# Patient Record
Sex: Male | Born: 1990 | Race: Black or African American | Hispanic: No | Marital: Single | Smoking: Light tobacco smoker
Health system: Southern US, Community
[De-identification: ages and names within clinical notes are randomized; demographics above are authoritative.]

## PROBLEM LIST (undated history)

## (undated) HISTORY — PX: KNEE ARTHROSCOPY: SUR90

## (undated) HISTORY — PX: TYMPANOSTOMY TUBE PLACEMENT: SHX32

---
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
## 2008-06-28 ENCOUNTER — Emergency Department (HOSPITAL_COMMUNITY): Admission: EM | Admit: 2008-06-28 | Discharge: 2008-06-29 | Payer: Self-pay | Admitting: Emergency Medicine

## 2008-10-12 ENCOUNTER — Emergency Department (HOSPITAL_COMMUNITY): Admission: EM | Admit: 2008-10-12 | Discharge: 2008-10-12 | Payer: Self-pay | Admitting: Family Medicine

## 2009-02-22 ENCOUNTER — Emergency Department (HOSPITAL_COMMUNITY): Admission: EM | Admit: 2009-02-22 | Discharge: 2009-02-22 | Payer: Self-pay | Admitting: Emergency Medicine

## 2009-06-01 ENCOUNTER — Emergency Department (HOSPITAL_COMMUNITY): Admission: EM | Admit: 2009-06-01 | Discharge: 2009-06-01 | Payer: Self-pay | Admitting: Emergency Medicine

## 2009-08-17 ENCOUNTER — Emergency Department (HOSPITAL_COMMUNITY): Admission: EM | Admit: 2009-08-17 | Discharge: 2009-08-17 | Payer: Self-pay | Admitting: Emergency Medicine

## 2010-05-30 LAB — POCT RAPID STREP A (OFFICE): Streptococcus, Group A Screen (Direct): NEGATIVE

## 2010-05-30 LAB — POCT INFECTIOUS MONO SCREEN: Mono Screen: NEGATIVE

## 2010-06-01 ENCOUNTER — Inpatient Hospital Stay (INDEPENDENT_AMBULATORY_CARE_PROVIDER_SITE_OTHER)
Admission: RE | Admit: 2010-06-01 | Discharge: 2010-06-01 | Disposition: A | Discharge status: Home / Self Care | Source: Ambulatory Visit | Attending: Family Medicine | Admitting: Family Medicine

## 2010-06-01 ENCOUNTER — Ambulatory Visit (INDEPENDENT_AMBULATORY_CARE_PROVIDER_SITE_OTHER)

## 2010-06-01 DIAGNOSIS — H109 Unspecified conjunctivitis: Secondary | ICD-10-CM

## 2010-06-01 DIAGNOSIS — J45909 Unspecified asthma, uncomplicated: Secondary | ICD-10-CM

## 2010-06-02 ENCOUNTER — Emergency Department (HOSPITAL_COMMUNITY)
Admission: EM | Admit: 2010-06-02 | Discharge: 2010-06-03 | Disposition: A | Discharge status: Home / Self Care | Attending: Emergency Medicine | Admitting: Emergency Medicine

## 2010-06-02 DIAGNOSIS — R05 Cough: Secondary | ICD-10-CM | POA: Insufficient documentation

## 2010-06-02 DIAGNOSIS — F29 Unspecified psychosis not due to a substance or known physiological condition: Secondary | ICD-10-CM | POA: Insufficient documentation

## 2010-06-02 DIAGNOSIS — R112 Nausea with vomiting, unspecified: Secondary | ICD-10-CM | POA: Insufficient documentation

## 2010-06-02 DIAGNOSIS — J029 Acute pharyngitis, unspecified: Secondary | ICD-10-CM | POA: Insufficient documentation

## 2010-06-02 DIAGNOSIS — R059 Cough, unspecified: Secondary | ICD-10-CM | POA: Insufficient documentation

## 2010-06-02 DIAGNOSIS — R5381 Other malaise: Secondary | ICD-10-CM | POA: Insufficient documentation

## 2010-06-02 DIAGNOSIS — R509 Fever, unspecified: Secondary | ICD-10-CM | POA: Insufficient documentation

## 2010-06-02 DIAGNOSIS — B9789 Other viral agents as the cause of diseases classified elsewhere: Secondary | ICD-10-CM | POA: Insufficient documentation

## 2010-06-02 DIAGNOSIS — R55 Syncope and collapse: Secondary | ICD-10-CM | POA: Insufficient documentation

## 2010-06-02 DIAGNOSIS — R5383 Other fatigue: Secondary | ICD-10-CM | POA: Insufficient documentation

## 2010-06-02 DIAGNOSIS — J069 Acute upper respiratory infection, unspecified: Secondary | ICD-10-CM | POA: Insufficient documentation

## 2010-06-02 DIAGNOSIS — R404 Transient alteration of awareness: Secondary | ICD-10-CM | POA: Insufficient documentation

## 2010-06-02 DIAGNOSIS — J3489 Other specified disorders of nose and nasal sinuses: Secondary | ICD-10-CM | POA: Insufficient documentation

## 2010-06-02 LAB — POCT I-STAT, CHEM 8
Chloride: 101 mEq/L (ref 96–112)
Creatinine, Ser: 1.3 mg/dL (ref 0.4–1.5)
HCT: 48 % (ref 39.0–52.0)
Potassium: 3.7 mEq/L (ref 3.5–5.1)
Sodium: 139 mEq/L (ref 135–145)

## 2012-06-15 ENCOUNTER — Emergency Department (HOSPITAL_COMMUNITY)
Admission: EM | Admit: 2012-06-15 | Discharge: 2012-06-16 | Disposition: A | Discharge status: Home / Self Care | Payer: Worker's Compensation | Attending: Emergency Medicine | Admitting: Emergency Medicine

## 2012-06-15 DIAGNOSIS — Y9389 Activity, other specified: Secondary | ICD-10-CM | POA: Insufficient documentation

## 2012-06-15 DIAGNOSIS — Z79899 Other long term (current) drug therapy: Secondary | ICD-10-CM | POA: Insufficient documentation

## 2012-06-15 DIAGNOSIS — Z23 Encounter for immunization: Secondary | ICD-10-CM | POA: Insufficient documentation

## 2012-06-15 DIAGNOSIS — S0502XA Injury of conjunctiva and corneal abrasion without foreign body, left eye, initial encounter: Secondary | ICD-10-CM

## 2012-06-15 DIAGNOSIS — S058X9A Other injuries of unspecified eye and orbit, initial encounter: Secondary | ICD-10-CM | POA: Insufficient documentation

## 2012-06-15 DIAGNOSIS — IMO0002 Reserved for concepts with insufficient information to code with codable children: Secondary | ICD-10-CM | POA: Insufficient documentation

## 2012-06-15 DIAGNOSIS — Y990 Civilian activity done for income or pay: Secondary | ICD-10-CM | POA: Insufficient documentation

## 2012-06-15 DIAGNOSIS — H538 Other visual disturbances: Secondary | ICD-10-CM | POA: Insufficient documentation

## 2012-06-15 DIAGNOSIS — Y9289 Other specified places as the place of occurrence of the external cause: Secondary | ICD-10-CM | POA: Insufficient documentation

## 2012-06-15 MED ORDER — TETANUS-DIPHTH-ACELL PERTUSSIS 5-2.5-18.5 LF-MCG/0.5 IM SUSP
0.5000 mL | Freq: Once | INTRAMUSCULAR | Status: AC
  Administered 2012-06-16: 0.5 mL via INTRAMUSCULAR
  Filled 2012-06-15: qty 0.5

## 2012-06-15 MED ORDER — FLUORESCEIN SODIUM 1 MG OP STRP
1.0000 | ORAL_STRIP | Freq: Once | OPHTHALMIC | Status: AC
  Administered 2012-06-16: 1 via OPHTHALMIC

## 2012-06-15 MED ORDER — TETRACAINE HCL 0.5 % OP SOLN
2.0000 [drp] | Freq: Once | OPHTHALMIC | Status: AC
  Administered 2012-06-16: 2 [drp] via OPHTHALMIC

## 2012-06-15 NOTE — ED Provider Notes (Signed)
History  This chart was scribed for non-physician practitioner Jaci Carrel, PA-C working with Loren Racer, MD, by Candelaria Stagers, ED Scribe. This patient was seen in room [facility] and the patient's care was started at 11:49 PM   CSN: [id number]  Arrival date & time 06/15/12  2320   None     No chief complaint on file.    The history is provided by the patient. No language interpreter was used.   John Griffith is a 22 y.o. male who presents to the Emergency Department complaining of sudden onset of left eye pain after getting a piece of glass in his eye after dropping a light bulb earlier today while at work.  Pt flushed the eye for about twenty minutes before arriving to ED with no relief.  Pt is experiencing blurred vision.  He is unsure of last tetanus. Non contact lens wearer     No past medical history on file.  No past surgical history on file.  No family history on file.  History  Substance Use Topics  . Smoking status: Not on file  . Smokeless tobacco: Not on file  . Alcohol Use: Not on file      Review of Systems  Allergies  Review of patient's allergies indicates no known allergies.  Home Medications   Current Outpatient Rx  Name  Route  Sig  Dispense  Refill  . loratadine (CLARITIN) 10 MG tablet   Oral   Take 10 mg by mouth daily.           There were no vitals taken for this visit.  Physical Exam  Nursing note and vitals reviewed. Constitutional: He is oriented to person, place, and time. He appears well-developed and well-nourished. No distress.  HENT:  Head: Normocephalic and atraumatic.  Eyes: Conjunctivae and EOM are normal. Pupils are equal, round, and reactive to light. No foreign body present in the left eye.  Pain free EOMs, no photosensitivity.  No proptosis, lid swelling, hyphema, purulent discharge from eyes, or consensual photophobia.  Eyelids everted, no evidence of FB.  Fluorescein study: corneal uptake, but no dendritic  pattern, c/w corneal abrasions.  Neck: Normal range of motion. Neck supple.  Pulmonary/Chest: Effort normal.  Neurological: He is alert and oriented to person, place, and time.  Skin: Skin is warm and dry. No rash noted. He is not diaphoretic.  Psychiatric: His behavior is normal.    ED Course  Procedures   DIAGNOSTIC STUDIES: BP 137/76  Pulse 89  Temp(Src) 98.6 F (37 C)  Resp 20  SpO2 100% .    COORDINATION OF CARE:  11:51 PM Will further examine eye with fluorescin uptake. Pt understands and agrees.   11:57 PM Will prescribe antibiotics.  Pt understands and agrees.   Labs Reviewed - No data to display No results found.   No diagnosis found.    MDM    Corneal abrasion  Pt with corneal abrasion on PE. Tdap given. Eye irrigated w NS, no evidence of FB.  No change in vision, acuity equal bilaterally.  Pt is not a contact lens wearer.  Exam non-concerning for orbital cellulitis, hyphema, corneal ulcers. Patient will be discharged home with erythromycin.   Patient understands to follow up with ophthalmology, & to return to ER if new symptoms develop including change in vision, purulent drainage, or entrapment. Driving restrictions discussed and eye patched.    I personally performed the services described in this documentation, which was scribed in my presence. The  recorded information has been reviewed and is accurate.         Jaci Carrel, [place] 06/16/12 0006

## 2012-06-16 ENCOUNTER — Encounter (HOSPITAL_COMMUNITY): Payer: Self-pay | Admitting: *Deleted

## 2012-06-16 MED ORDER — ERYTHROMYCIN 5 MG/GM OP OINT: TOPICAL_OINTMENT | OPHTHALMIC | Status: DC

## 2012-06-16 NOTE — ED Notes (Signed)
Visual acuity 20/20 both eyes with corrective lens; 20/25 left eye; 20/20 right eye; pt states wears glasses all the time; does not wear contacts; eye patch placed per PA at pt request for comfort
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
## 2012-06-19 NOTE — ED Provider Notes (Signed)
Medical screening examination/treatment/procedure(s) were performed by non-physician practitioner and as supervising physician I was immediately available for consultation/collaboration.   Loren Racer, MD 06/19/12 1348

## 2012-12-26 ENCOUNTER — Emergency Department (HOSPITAL_COMMUNITY): Payer: Worker's Compensation

## 2012-12-26 ENCOUNTER — Encounter (HOSPITAL_COMMUNITY): Payer: Self-pay | Admitting: Emergency Medicine

## 2012-12-26 ENCOUNTER — Emergency Department (HOSPITAL_COMMUNITY)
Admission: EM | Admit: 2012-12-26 | Discharge: 2012-12-26 | Disposition: A | Discharge status: Home / Self Care | Payer: Worker's Compensation | Attending: Emergency Medicine | Admitting: Emergency Medicine

## 2012-12-26 DIAGNOSIS — Y9289 Other specified places as the place of occurrence of the external cause: Secondary | ICD-10-CM | POA: Insufficient documentation

## 2012-12-26 DIAGNOSIS — M25512 Pain in left shoulder: Secondary | ICD-10-CM

## 2012-12-26 DIAGNOSIS — X500XXA Overexertion from strenuous movement or load, initial encounter: Secondary | ICD-10-CM | POA: Insufficient documentation

## 2012-12-26 DIAGNOSIS — Y990 Civilian activity done for income or pay: Secondary | ICD-10-CM | POA: Insufficient documentation

## 2012-12-26 DIAGNOSIS — S4980XA Other specified injuries of shoulder and upper arm, unspecified arm, initial encounter: Secondary | ICD-10-CM | POA: Insufficient documentation

## 2012-12-26 DIAGNOSIS — S46909A Unspecified injury of unspecified muscle, fascia and tendon at shoulder and upper arm level, unspecified arm, initial encounter: Secondary | ICD-10-CM | POA: Insufficient documentation

## 2012-12-26 DIAGNOSIS — Y9389 Activity, other specified: Secondary | ICD-10-CM | POA: Insufficient documentation

## 2012-12-26 MED ORDER — IBUPROFEN 600 MG PO TABS: 600.0000 mg | ORAL_TABLET | Freq: Four times a day (QID) | ORAL | Status: AC | PRN

## 2012-12-26 MED ORDER — TRAMADOL HCL 50 MG PO TABS: 50.0000 mg | ORAL_TABLET | Freq: Four times a day (QID) | ORAL | Status: AC | PRN

## 2012-12-26 NOTE — ED Notes (Signed)
Pt states that he was just lifting something heavy at work and feels like he may have injured his L shoulder.

## 2012-12-26 NOTE — ED Notes (Signed)
EDPA Kelly at bedside 

## 2012-12-26 NOTE — ED Provider Notes (Signed)
CSN: [id number]     Arrival date & time 12/26/12  0041 History   First MD Initiated Contact with Patient 12/26/12 0045     Chief Complaint  Patient presents with  . Shoulder Injury   (Consider location/radiation/quality/duration/timing/severity/associated sxs/prior Treatment) HPI Comments: Patient is a 22 y/o male who presents for sudden onset L shoulder pain after attempting to lift a heavy box from chest level to lower it onto the floor. Patient felt a pop in his shoulder followed by sudden onset throbbing pain that is nonradiating. Pain constant since onset. Symptoms associated with decreased ROM. Pain worse with palpation and movement. No hx of prior should dislocations.   Patient is a 22 y.o. male presenting with shoulder injury. The history is provided by the patient. No language interpreter was used.  Shoulder Injury This is a new problem. The current episode started today. The problem occurs constantly. The problem has been unchanged. Associated symptoms include arthralgias, joint swelling and myalgias. Pertinent negatives include no fever, numbness or weakness. Exacerbated by: movement and palpation. He has tried ice for the symptoms. The treatment provided mild relief.    History reviewed. No pertinent past medical history. Past Surgical History  Procedure Laterality Date  . Knee arthroscopy    . Tympanostomy tube placement     No family history on file. History  Substance Use Topics  . Smoking status: Never Smoker   . Smokeless tobacco: Not on file  . Alcohol Use: No    Review of Systems  Constitutional: Negative for fever.  Musculoskeletal: Positive for arthralgias, joint swelling and myalgias.  Neurological: Negative for weakness and numbness.  All other systems reviewed and are negative.    Allergies  Review of patient's allergies indicates no known allergies.  Home Medications   Current Outpatient Rx  Name  Route  Sig  Dispense  Refill  . Vitamin D,  Ergocalciferol, (DRISDOL) 50000 UNITS CAPS capsule   Oral   Take 50,000 Units by mouth every 7 (seven) days. monday         . ibuprofen (ADVIL,MOTRIN) 600 MG tablet   Oral   Take 1 tablet (600 mg total) by mouth every 6 (six) hours as needed for pain.   30 tablet   0   . traMADol (ULTRAM) 50 MG tablet   Oral   Take 1 tablet (50 mg total) by mouth every 6 (six) hours as needed for pain.   15 tablet   0    BP 134/66  Pulse 72  Temp(Src) 98.3 F (36.8 C) (Oral)  Resp 15  SpO2 100%  Physical Exam  Nursing note and vitals reviewed. Constitutional: He is oriented to person, place, and time. He appears well-developed and well-nourished. No distress.  HENT:  Head: Normocephalic and atraumatic.  Eyes: Conjunctivae and EOM are normal. No scleral icterus.  Neck: Normal range of motion.  Cardiovascular: Normal rate, regular rhythm and intact distal pulses.   Pulses:      Radial pulses are 2+ on the left side.  Pulmonary/Chest: Effort normal. No respiratory distress.  Musculoskeletal:       Left shoulder: He exhibits decreased range of motion, tenderness, bony tenderness, pain and spasm. He exhibits no swelling, no effusion, normal pulse and normal strength.       Cervical back: Normal.       Left upper arm: Normal.  Neurological: He is alert and oriented to person, place, and time.  Skin: Skin is warm and dry. No rash noted. He  is not diaphoretic. No erythema. No pallor.  Psychiatric: He has a normal mood and affect. His behavior is normal.    ED Course  Procedures (including critical care time) Labs Review Labs Reviewed - No data to display  Imaging Review Dg Shoulder Left  12/26/2012   CLINICAL DATA:  Left shoulder pain.  EXAM: LEFT SHOULDER - 2+ VIEW  COMPARISON:  No priors.  FINDINGS: Multiple views of the left shoulder demonstrate no acute displaced fracture, subluxation, dislocation, or soft tissue abnormality.  IMPRESSION: No acute radiographic abnormality of the left  shoulder.   Electronically Signed   By: Trudie Reed M.D.   On: 12/26/2012 01:17    EKG Interpretation   None       MDM   1. Left shoulder pain    Left shoulder pain secondary to lifting a heavy box this evening. Patient well and nontoxic appearing, hemodynamically stable, and afebrile. He is neurovascularly intact with limited range of motion secondary to pain and discomfort. Normal strength against resistance and sensation in his left upper extremity. X-ray without evidence of fracture or dislocation of the left shoulder. Patient given shoulder immobilizer and ED for comfort. He is appropriate for discharge with RICE instruction as well as ibuprofen for symptom control. Tramadol prescribed for breakthrough pain. Return precautions discussed and patient agreeable to plan with no unaddressed concerns.    Antony Madura, PA-C 12/26/12 [phone]

## 2012-12-26 NOTE — ED Provider Notes (Signed)
Medical screening examination/treatment/procedure(s) were performed by non-physician practitioner and as supervising physician I was immediately available for consultation/collaboration.   Loren Racer, MD 12/26/12 [phone]

## 2012-12-26 NOTE — ED Notes (Signed)
Shoulder immobilizer placed on patient.

## 2014-01-08 ENCOUNTER — Emergency Department (HOSPITAL_COMMUNITY)
Admission: EM | Admit: 2014-01-08 | Discharge: 2014-01-08 | Discharge status: Against Medical Advice | Attending: Emergency Medicine | Admitting: Emergency Medicine

## 2014-01-08 ENCOUNTER — Encounter (HOSPITAL_COMMUNITY): Payer: Self-pay | Admitting: Emergency Medicine

## 2014-01-08 DIAGNOSIS — S3992XA Unspecified injury of lower back, initial encounter: Secondary | ICD-10-CM | POA: Insufficient documentation

## 2014-01-08 DIAGNOSIS — S199XXA Unspecified injury of neck, initial encounter: Secondary | ICD-10-CM | POA: Insufficient documentation

## 2014-01-08 DIAGNOSIS — Y9389 Activity, other specified: Secondary | ICD-10-CM | POA: Insufficient documentation

## 2014-01-08 DIAGNOSIS — S0993XA Unspecified injury of face, initial encounter: Secondary | ICD-10-CM | POA: Insufficient documentation

## 2014-01-08 DIAGNOSIS — Y998 Other external cause status: Secondary | ICD-10-CM | POA: Insufficient documentation

## 2014-01-08 DIAGNOSIS — Y9289 Other specified places as the place of occurrence of the external cause: Secondary | ICD-10-CM | POA: Insufficient documentation
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
## 2014-01-08 NOTE — ED Notes (Signed)
PT states "I am not going to stay"; RN asked if pt go hold on to sign information; pt states "No I am just gonna leave" and walks out the door; pt left AMA after triage refusing to sign
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
## 2014-07-12 IMAGING — CR DG SHOULDER 2+V*L*
3 series · 3 of 3 positions shown · non-contrast
Comparison: No priors.

CLINICAL DATA: Left shoulder pain.

EXAM:
LEFT SHOULDER - 2+ VIEW

[w shoulder internal left]
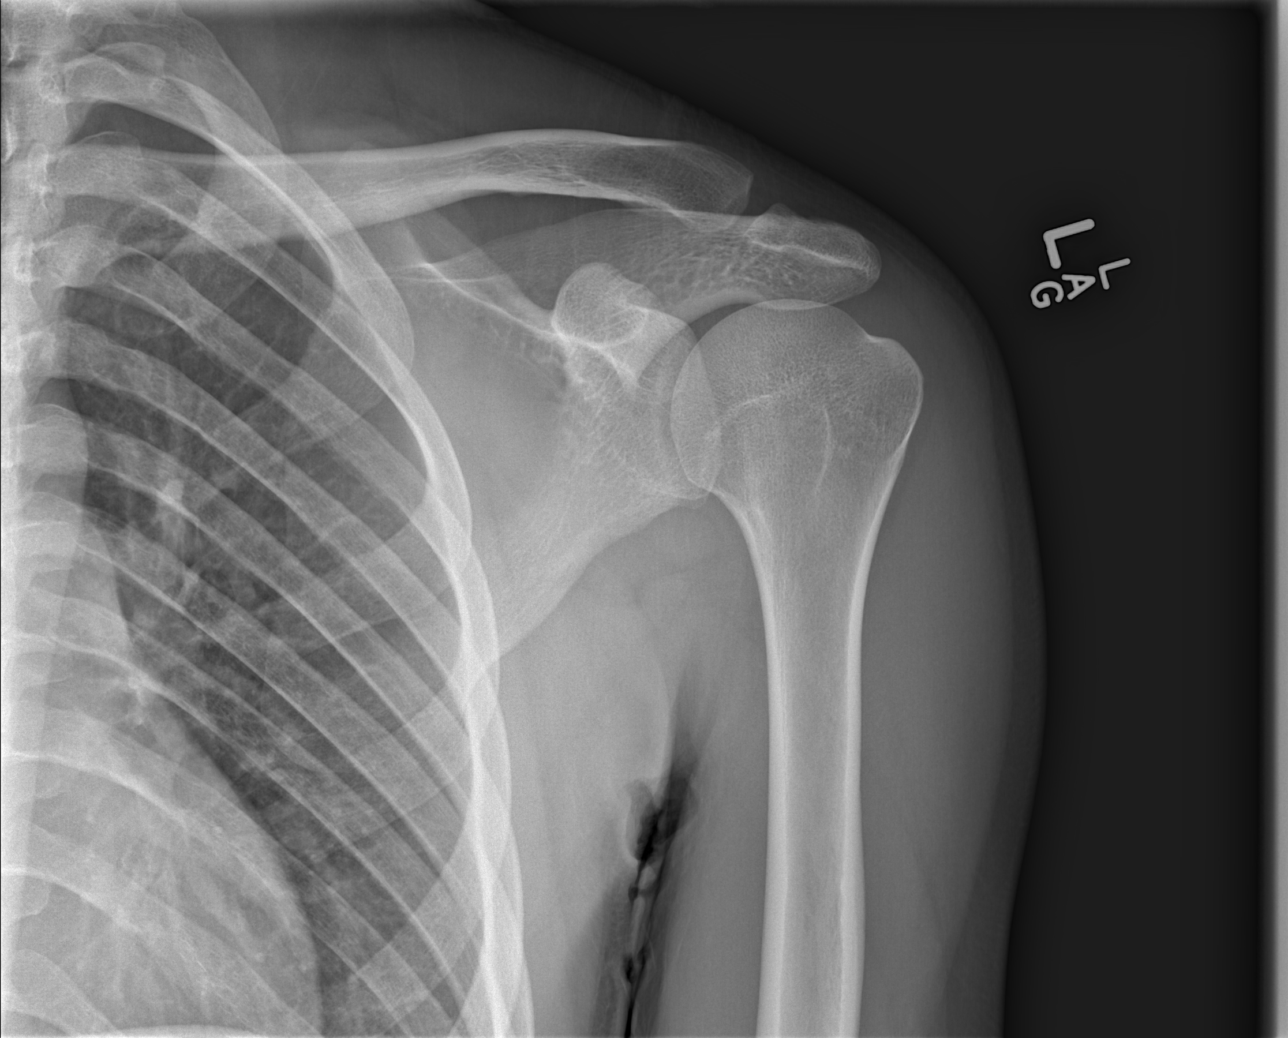

[w shoulder y-view left]
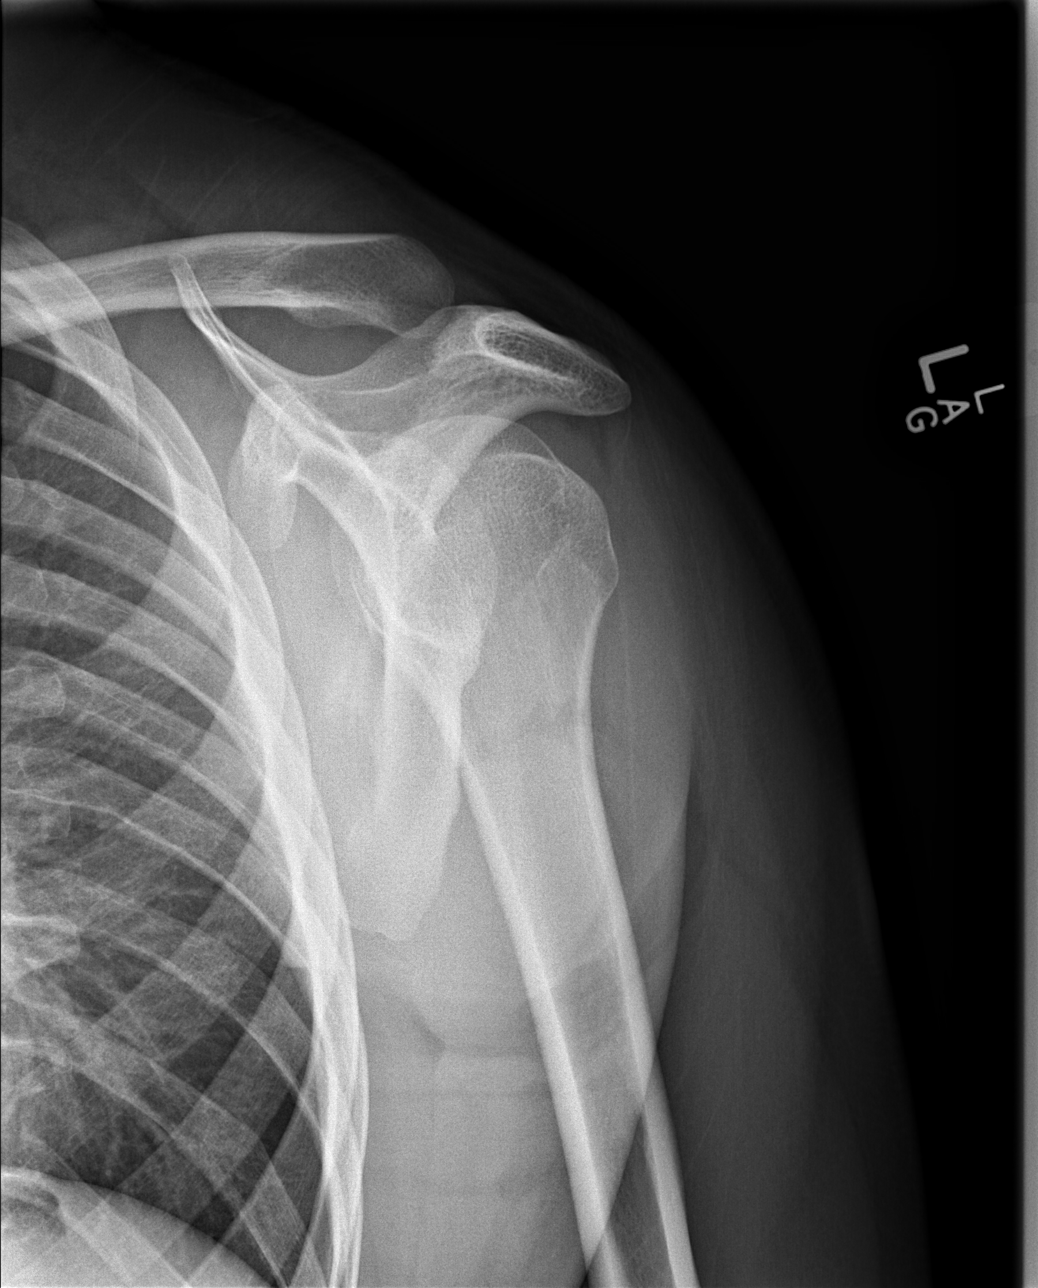

[x shoulder axillary left]
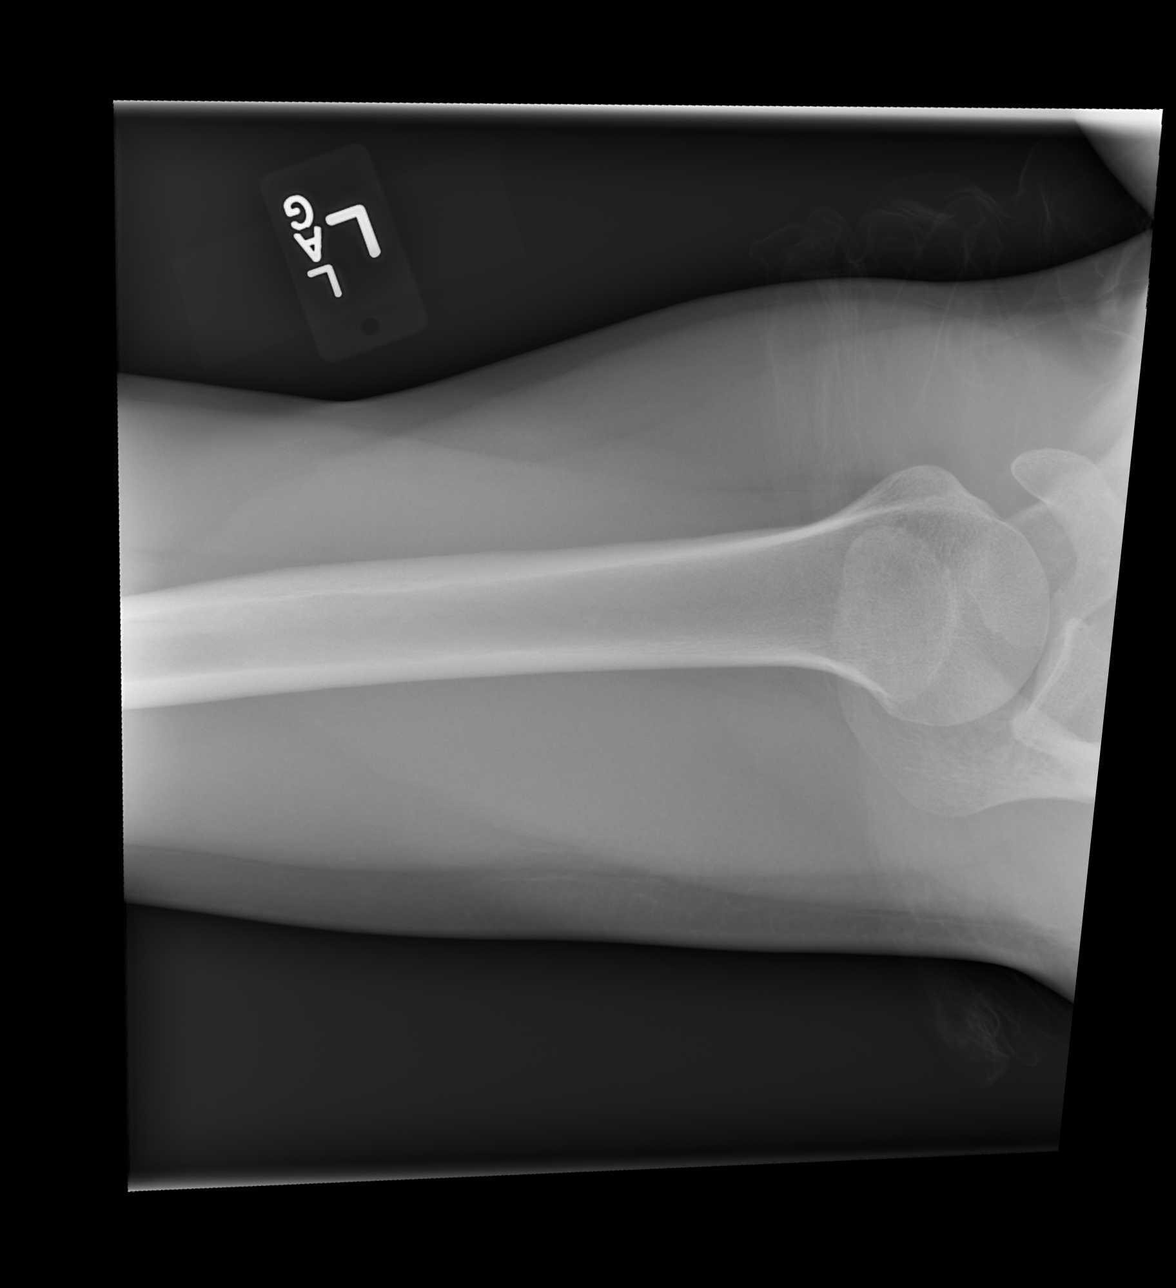

[3 of 3 positions shown; findings below may reference images not displayed]

FINDINGS: Multiple views of the left shoulder demonstrate no acute displaced
fracture, subluxation, dislocation, or soft tissue abnormality.
IMPRESSION: No acute radiographic abnormality of the left shoulder.

## 2015-01-31 ENCOUNTER — Encounter (HOSPITAL_COMMUNITY): Payer: Self-pay | Admitting: Emergency Medicine

## 2015-01-31 ENCOUNTER — Emergency Department (HOSPITAL_COMMUNITY)
Admission: EM | Admit: 2015-01-31 | Discharge: 2015-02-01 | Disposition: A | Discharge status: Home / Self Care | Attending: Emergency Medicine | Admitting: Emergency Medicine

## 2015-01-31 DIAGNOSIS — F17210 Nicotine dependence, cigarettes, uncomplicated: Secondary | ICD-10-CM | POA: Insufficient documentation

## 2015-01-31 DIAGNOSIS — R000 Tachycardia, unspecified: Secondary | ICD-10-CM | POA: Insufficient documentation

## 2015-01-31 DIAGNOSIS — R509 Fever, unspecified: Secondary | ICD-10-CM

## 2015-01-31 DIAGNOSIS — J020 Streptococcal pharyngitis: Secondary | ICD-10-CM | POA: Insufficient documentation

## 2015-01-31 NOTE — ED Notes (Signed)
C/o sore throat since last night.  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  Took Nyquil 2 hours ago.

## 2015-02-01 LAB — RAPID STREP SCREEN (MED CTR MEBANE ONLY): STREPTOCOCCUS, GROUP A SCREEN (DIRECT): POSITIVE — AB

## 2015-02-01 MED ORDER — IBUPROFEN 400 MG PO TABS
800.0000 mg | ORAL_TABLET | Freq: Once | ORAL | Status: AC
  Administered 2015-02-01: 800 mg via ORAL
  Filled 2015-02-01: qty 2

## 2015-02-01 MED ORDER — PENICILLIN G BENZATHINE 1200000 UNIT/2ML IM SUSP
1.2000 10*6.[IU] | Freq: Once | INTRAMUSCULAR | Status: AC
  Administered 2015-02-01: 1.2 10*6.[IU] via INTRAMUSCULAR
  Filled 2015-02-01: qty 2

## 2015-02-01 NOTE — Discharge Instructions (Signed)
You have been treated for strep throat. You may continue to run a fever for the next few days which is normal-- take tylenol or motrin for this. Return here for new concerns.  Strep Throat Strep throat is a bacterial infection of the throat. Your health care provider may call the infection tonsillitis or pharyngitis, depending on whether there is swelling in the tonsils or at the back of the throat. Strep throat is most common during the cold months of the year in children who are 26-26 years of age, but it can happen during any season in people of any age. This infection is spread from person to person (contagious) through coughing, sneezing, or close contact. CAUSES Strep throat is caused by the bacteria called Streptococcus pyogenes. RISK FACTORS This condition is more likely to develop in:  People who spend time in crowded places where the infection can spread easily.  People who have close contact with someone who has strep throat. SYMPTOMS Symptoms of this condition include:  Fever or chills.   Redness, swelling, or pain in the tonsils or throat.  Pain or difficulty when swallowing.  White or yellow spots on the tonsils or throat.  Swollen, tender glands in the neck or under the jaw.  Red rash all over the body (rare). DIAGNOSIS This condition is diagnosed by performing a rapid strep test or by taking a swab of your throat (throat culture test). Results from a rapid strep test are usually ready in a few minutes, but throat culture test results are available after one or two days. TREATMENT This condition is treated with antibiotic medicine. HOME CARE INSTRUCTIONS Medicines  Take over-the-counter and prescription medicines only as told by your health care provider.  Take your antibiotic as told by your health care provider. Do not stop taking the antibiotic even if you start to feel better.  Have family members who also have a sore throat or fever tested for strep throat.  They may need antibiotics if they have the strep infection. Eating and Drinking  Do not share food, drinking cups, or personal items that could cause the infection to spread to other people.  If swallowing is difficult, try eating soft foods until your sore throat feels better.  Drink enough fluid to keep your urine clear or pale yellow. General Instructions  Gargle with a salt-water mixture 3-4 times per day or as needed. To make a salt-water mixture, completely dissolve -1 tsp of salt in 1 cup of warm water.  Make sure that all household members wash their hands well.  Get plenty of rest.  Stay home from school or work until you have been taking antibiotics for 24 hours.  Keep all follow-up visits as told by your health care provider. This is important. SEEK MEDICAL CARE IF:  The glands in your neck continue to get bigger.  You develop a rash, cough, or earache.  You cough up a thick liquid that is green, yellow-brown, or bloody.  You have pain or discomfort that does not get better with medicine.  Your problems seem to be getting worse rather than better.  You have a fever. SEEK IMMEDIATE MEDICAL CARE IF:  You have new symptoms, such as vomiting, severe headache, stiff or painful neck, chest pain, or shortness of breath.  You have severe throat pain, drooling, or changes in your voice.  You have swelling of the neck, or the skin on the neck becomes red and tender.  You have signs of dehydration, such  as fatigue, dry mouth, and decreased urination.  You become increasingly sleepy, or you cannot wake up completely.  Your joints become red or painful.   This information is not intended to replace advice given to you by your health care provider. Make sure you discuss any questions you have with your health care provider.   Document Released: 02/06/2000 Document Revised: 10/30/2014 Document Reviewed: 06/03/2014 Elsevier Interactive Patient Education [employer].
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
